# Patient Record
Sex: Female | Born: 1980 | Hispanic: No | Marital: Single | Smoking: Current some day smoker
Health system: Southern US, Community
[De-identification: ages and names within clinical notes are randomized; demographics above are authoritative.]

## PROBLEM LIST (undated history)

## (undated) DIAGNOSIS — E079 Disorder of thyroid, unspecified: Secondary | ICD-10-CM

## (undated) DIAGNOSIS — J45909 Unspecified asthma, uncomplicated: Secondary | ICD-10-CM

## (undated) DIAGNOSIS — I10 Essential (primary) hypertension: Secondary | ICD-10-CM

---

## 2017-12-29 ENCOUNTER — Emergency Department
Admission: EM | Admit: 2017-12-29 | Discharge: 2017-12-29 | Disposition: A | Discharge status: Home / Self Care | Payer: 59 | Attending: Emergency Medicine | Admitting: Emergency Medicine

## 2017-12-29 ENCOUNTER — Emergency Department: Payer: 59

## 2017-12-29 ENCOUNTER — Other Ambulatory Visit: Payer: Self-pay

## 2017-12-29 DIAGNOSIS — F17200 Nicotine dependence, unspecified, uncomplicated: Secondary | ICD-10-CM | POA: Insufficient documentation

## 2017-12-29 DIAGNOSIS — J45901 Unspecified asthma with (acute) exacerbation: Secondary | ICD-10-CM

## 2017-12-29 DIAGNOSIS — J4541 Moderate persistent asthma with (acute) exacerbation: Secondary | ICD-10-CM | POA: Diagnosis not present

## 2017-12-29 DIAGNOSIS — R0602 Shortness of breath: Secondary | ICD-10-CM | POA: Diagnosis present

## 2017-12-29 HISTORY — DX: Unspecified asthma, uncomplicated: J45.909

## 2017-12-29 MED ORDER — PREDNISONE 20 MG PO TABS
60.0000 mg | ORAL_TABLET | Freq: Once | ORAL | Status: AC
  Administered 2017-12-29: 60 mg via ORAL
  Filled 2017-12-29: qty 3

## 2017-12-29 MED ORDER — IPRATROPIUM-ALBUTEROL 0.5-2.5 (3) MG/3ML IN SOLN
3.0000 mL | Freq: Once | RESPIRATORY_TRACT | Status: AC
  Administered 2017-12-29: 3 mL via RESPIRATORY_TRACT
  Filled 2017-12-29: qty 6

## 2017-12-29 MED ORDER — IPRATROPIUM-ALBUTEROL 0.5-2.5 (3) MG/3ML IN SOLN: 3.0000 mL | Freq: Four times a day (QID) | RESPIRATORY_TRACT | 1 refills | Status: AC | PRN

## 2017-12-29 MED ORDER — IPRATROPIUM-ALBUTEROL 0.5-2.5 (3) MG/3ML IN SOLN
3.0000 mL | Freq: Once | RESPIRATORY_TRACT | Status: AC
  Administered 2017-12-29: 3 mL via RESPIRATORY_TRACT

## 2017-12-29 MED ORDER — ALBUTEROL SULFATE (2.5 MG/3ML) 0.083% IN NEBU: 5.0000 mg | INHALATION_SOLUTION | Freq: Once | RESPIRATORY_TRACT | Status: DC

## 2017-12-29 NOTE — ED Provider Notes (Signed)
[facility] Emergency Department Provider Note       Time seen: ----------------------------------------- 4:54 PM on 12/29/2017 -----------------------------------------   I have reviewed the triage vital signs and the nursing notes.  HISTORY   Chief Complaint Shortness of Breath    HPI Stacy Hall is a 37 y.o. female with a history of asthma who presents to the ED for [place] of breath started 2 days ago.  Patient moved here proximal 4 days ago and since then she is been having increased shortness of breath.  She typically sees a pulmonologist every 2 weeks and receives an injection of Xolair every month.  She denies fevers, chills or other complaints.  Past Medical History:  Diagnosis Date  . Asthma     There are no active problems to display for this patient.   History reviewed. No pertinent surgical history.  Allergies Biaxin [clarithromycin] and Sulfa antibiotics  Social History Social History   Tobacco Use  . Smoking status: Current Some Day Smoker  . Smokeless tobacco: Never Used  Substance Use Topics  . Alcohol use: Not Currently  . Drug use: Never   Review of Systems Constitutional: Negative for fever. ENT: Positive for congestion Cardiovascular: Negative for chest pain. Respiratory: Positive for shortness of breath and wheezing Gastrointestinal: Negative for abdominal pain, vomiting and diarrhea. Musculoskeletal: Negative for back pain. Skin: Negative for rash. Neurological: Negative for headaches, focal weakness or numbness.  All systems negative/normal/unremarkable except as stated in the HPI  ____________________________________________   PHYSICAL EXAM:  VITAL SIGNS: ED Triage Vitals  Enc Vitals Group     BP 12/29/17 1605 137/86     Pulse Rate 12/29/17 1605 81     Resp 12/29/17 1605 16     Temp 12/29/17 1605 98.7 F (37.1 C)     Temp Source 12/29/17 1605 Oral     SpO2 12/29/17 1605 97 %     Weight 12/29/17 1605  250 lb (113.4 kg)     Height 12/29/17 1605 5\' 3"  (1.6 m)     Head Circumference --      Peak Flow --      Pain Score 12/29/17 1616 5     Pain Loc --      Pain Edu? --      Excl. in GC? --    Constitutional: Alert and oriented. Well appearing and in no distress. Eyes: Conjunctivae are normal. Normal extraocular movements. ENT   Head: Normocephalic and atraumatic.   Nose: No congestion/rhinnorhea.   Mouth/Throat: Mucous membranes are moist.   Neck: No stridor. Cardiovascular: Normal rate, regular rhythm. No murmurs, rubs, or gallops. Respiratory: Normal respiratory effort without tachypnea nor retractions. Breath sounds are equal with mild wheezing Gastrointestinal: Soft and nontender. Normal bowel sounds Musculoskeletal: Nontender with normal range of motion in extremities. No lower extremity tenderness nor edema. Neurologic:  Normal speech and language. No gross focal neurologic deficits are appreciated.  Skin:  Skin is warm, dry and intact. No rash noted. Psychiatric: Mood and affect are normal. Speech and behavior are normal.  ____________________________________________  ED COURSE:  As part of my medical decision making, I reviewed the following data within the electronic MEDICAL RECORD NUMBER History obtained from family if available, nursing notes, old chart and ekg, as well as notes from prior ED visits. Patient presented for [place] of breath and likely asthma exacerbation, we will assess with  imaging as indicated at this time.  She will receive a DuoNeb and oral steroids.  Procedures RADIOLOGY Images were viewed by me  Chest x-ray is unremarkable  ____________________________________________  DIFFERENTIAL DIAGNOSIS   Asthma, URI, allergies, pneumonia  FINAL ASSESSMENT AND PLAN  Asthma exacerbation   Plan: The patient had presented for shortness of breath. Patient's imaging reveal any acute process.  She was feeling better after DuoNeb but I will write for  same.  I have encouraged her to use albuterol unless she feels like she cannot breathe and then to try a DuoNeb breathing treatment instead.  She states she has prednisone at home, I have advised she take a 5-day burst of 50 mg.   Ulice Dash, MD   Note: This note was generated in part or whole with voice recognition software. Voice recognition is usually quite accurate but there are transcription errors that can and very often do occur. I apologize for any typographical errors that were not detected and corrected.     Emily Filbert, MD 12/29/17 1750

## 2017-12-29 NOTE — ED Triage Notes (Signed)
Pt arrives via POV, pt ambulatory to triage with NAD noted at this time. Pt states SOB started 2 days ago, moved here 4 days ago, pt reports asthma and sees a pulmonologist every 2 weeks. Pt state has an apt on tues with specialist here about allergies. Pt able to speak in full sentences without distress during triage.

## 2017-12-29 NOTE — ED Notes (Signed)
NAD noted at time of D/C. Pt denies questions or concerns. Pt ambulatory to the lobby at this time.  

## 2017-12-31 ENCOUNTER — Institutional Professional Consult (permissible substitution): Payer: Self-pay | Admitting: Internal Medicine

## 2018-01-03 ENCOUNTER — Ambulatory Visit (INDEPENDENT_AMBULATORY_CARE_PROVIDER_SITE_OTHER): Payer: 59 | Admitting: Internal Medicine

## 2018-01-03 ENCOUNTER — Encounter: Payer: Self-pay | Admitting: Internal Medicine

## 2018-01-03 VITALS — BP 146/90 | HR 100 | Ht 61.75 in | Wt 250.0 lb

## 2018-01-03 DIAGNOSIS — J45909 Unspecified asthma, uncomplicated: Secondary | ICD-10-CM | POA: Insufficient documentation

## 2018-01-03 DIAGNOSIS — F17210 Nicotine dependence, cigarettes, uncomplicated: Secondary | ICD-10-CM | POA: Insufficient documentation

## 2018-01-03 LAB — NITRIC OXIDE: NITRIC OXIDE: 6

## 2018-01-03 MED ORDER — BUDESONIDE-FORMOTEROL FUMARATE 160-4.5 MCG/ACT IN AERO: 2.0000 | INHALATION_SPRAY | Freq: Two times a day (BID) | RESPIRATORY_TRACT | 0 refills | Status: DC

## 2018-01-03 MED ORDER — BUDESONIDE-FORMOTEROL FUMARATE 160-4.5 MCG/ACT IN AERO: INHALATION_SPRAY | RESPIRATORY_TRACT | 12 refills | Status: AC

## 2018-01-03 MED ORDER — OMEPRAZOLE 40 MG PO CPDR: DELAYED_RELEASE_CAPSULE | ORAL | 2 refills | Status: DC

## 2018-01-03 MED ORDER — CEFDINIR 300 MG PO CAPS: 300.0000 mg | ORAL_CAPSULE | Freq: Two times a day (BID) | ORAL | 0 refills | Status: DC

## 2018-01-03 NOTE — Progress Notes (Signed)
Stacy Hall, female    DOB: 06/23/1980,    MRN: [id number]    History of Present Illness  37 yowf active smoker with dx of asthma as child multiple admits intubated p  First started smoking  cigarettes at age 37 placed on xolair as maint 2011 and overall improved and "no need for maint inhalers" but still needed maybe twice yearly prednisone courses, even  more while actively smoking  self referred to pulmonary clinic 01/03/2018 p moving from [place] to [facility] Sept 24th and already in ER 12/29/17   History of Present Illness  01/03/2018  Pulmonary / 1st office eval  Chief Complaint  Patient presents with  . Pulmonary Consult    Self referral. Pt states dxed with allergic asthma at age 2. She moves to [place] from [place] a wk ago. She states once she moved here she started to have increased chest tightness and inability to take in a deep breath. She has been on Xolair for 8 yrs and is 2 wks late for her injection. She is using her Duoneb 2 x daily and her albuterol inhaler 2-3 x per day.   Arrived in [place] Sept 24th last dose of xolair first week of September but by 12/28/17  On avg day before onset of flare 2  X 2 x daily > then neb every 4 hours  Dark green chunks in am assoc with ?sinus HA in am as well  Last pred 01/02/18  Prone flat bed 2 pillows  Had symptoms sitting driving over to office p last saba neb 3h   of ov and admits to considerable anxiety contributing   No obvious day to day or daytime variability or assoc  mucus plugs or hemoptysis or cp or   subjective wheeze or overt sinus or hb symptoms.    Sleeps ok  without nocturnal  or early am exacerbation  of respiratory  c/o's or need for noct saba. Also denies any obvious fluctuation of symptoms with weather or environmental changes or other aggravating or alleviating factors except as outlined above   No unusual exposure hx or h/o childhood pna/ asthma or knowledge of premature birth.  Current Allergies, Complete Past Medical History, Past  Surgical History, Family History, and Social History were reviewed in [employer] record.  ROS  The following are not active complaints unless bolded Hoarseness, sore throat, dysphagia, dental problems, itching, sneezing,  nasal congestion or discharge of excess mucus or purulent secretions, ear ache,   fever, chills, sweats, unintended wt loss or wt gain, classically pleuritic or exertional cp,  orthopnea pnd or arm/hand swelling  or leg swelling, presyncope, palpitations, abdominal pain, anorexia, nausea, vomiting, diarrhea  or change in bowel habits or change in bladder habits, change in stools or change in urine, dysuria, hematuria,  rash, arthralgias, visual complaints, headache bifrontal esp in am , numbness, weakness or ataxia or problems with walking or coordination,  change in mood or  memory.            Past Medical History:  Diagnosis Date  . Asthma     Outpatient Medications Prior to Visit  Medication Sig Dispense Refill  . albuterol (PROAIR HFA) 108 (90 Base) MCG/ACT inhaler Inhale 2 puffs into the lungs every 6 (six) hours as needed for wheezing or shortness of breath.    . ALPRAZolam (XANAX) 0.5 MG tablet Take 0.5 mg by mouth at bedtime as needed for anxiety.    Marland Kitchen ipratropium-albuterol (DUONEB) 0.5-2.5 (3) MG/3ML  SOLN Take 3 mLs by nebulization every 6 (six) hours as needed. 360 mL 1  . levothyroxine (SYNTHROID, LEVOTHROID) 50 MCG tablet Take 50 mcg by mouth daily before breakfast.    . loratadine-pseudoephedrine (CLARITIN-D 24-HOUR) 10-240 MG 24 hr tablet Take 1 tablet by mouth daily.    Marland Kitchen omeprazole (PRILOSEC) 20 MG capsule Take 20 mg by mouth daily.    Marland Kitchen PARoxetine (PAXIL) 10 MG tablet Take 10 mg by mouth daily.               Objective:     BP (!) 146/90 (BP Location: Left Arm, Cuff Size: Normal)   Pulse 100   Ht 5' 1.75" (1.568 m)   Wt 250 lb (113.4 kg)   LMP 12/23/2017   SpO2 98%   BMI 46.10 kg/m   SpO2: 98 % RA   HEENT: nl  dentition, turbinates bilaterally, and oropharynx. Nl external ear canals without cough reflex   NECK :  without JVD/Nodes/TM/ nl carotid upstrokes bilaterally   LUNGS: no acc muscle use,  Nl contour chest which is clear to A and P bilaterally without cough on insp or exp maneuvers   CV:  RRR  no s3 or murmur or increase in P2, and no edema   ABD:  Obese /soft and nontender with nl inspiratory excursion in the supine position. No bruits or organomegaly appreciated, bowel sounds nl  MS:  Nl gait/ ext warm without deformities, calf tenderness, cyanosis or clubbing No obvious joint restrictions   SKIN: warm and dry without lesions    NEURO:  alert, approp, nl sensorium with  no motor or cerebellar deficits apparent.      I personally reviewed images and agree with radiology impression as follows:  CXR:   12/29/17 No active cardiopulmonary disease.      Assessment   Allergic asthma FENO 01/03/2018  =   6  Last pred 01/02/18  - Spirometry 01/03/2018  FEV1 1.7 (61%)  Ratio 83 w/in 3 h of duoneb  But still sense sob at rest - 01/03/2018  After extensive coaching inhaler device,  effectiveness =    90% from baseline 50% > try symb 160 2bid    DDX of  difficult airways management almost all start with A and  include Adherence, Ace Inhibitors, Acid Reflux, Active Sinus Disease, Alpha 1 Antitripsin deficiency, Anxiety masquerading as Airways dz,  ABPA,  Allergy(esp in young), Aspiration (esp in elderly), Adverse effects of meds,  Active smokers, A bunch of PE's (a small clot burden can't cause this syndrome unless there is already severe underlying pulm or vascular dz with poor reserve) plus two Bs  = Bronchiectasis and Beta blocker use..and one C= CHF    Adherence is always the initial "prime suspect" and is a multilayered concern that requires a "trust but verify" approach in every patient - starting with knowing how to use medications, especially inhalers, correctly, keeping up with  refills and understanding the fundamental difference between maintenance and prns vs those medications only taken for a very short course and then stopped and not refilled.  - see hfa teaching  - return  with all meds in hand using a trust but verify approach to confirm accurate Medication  Reconciliation The principal here is that until we are certain that the  patients are doing what we've asked, it makes no sense to ask them to do more.    Active smoking top of the rest of the list (see separate a/p)   ?  Acid (or non-acid) GERD > always difficult to exclude as up to 75% of pts in some series report no assoc GI/ Heartburn symptoms> rec max (24h)  acid suppression and diet restrictions/ reviewed and instructions given in writing.   Allergy/ ? xolair dep (doubt in absence of more rhinitis or obvious atopic hx) will try high dose symbicort as clearly breaking rule of 2s while on xolair chronically.  - will explore whether insurance will just be transferring xolair rx here or not.  ? Active sinus dz > omnicef x 10 days then sinus ct prn   ? abpa > allergy profile next ov with repeat IgE office before restarting singulair if possible   ? Anxiety > usually at the bottom of this list of usual suspects but should be much higher on this pt's based on H and P and note already on psychotropics and may interfere with adherence and also interpretation of response or lack thereof to symptom management which can be quite subjective.              Cigarette smoker 4-5 min discussion re active cigarette smoking in addition to office E&M  Ask about tobacco use:   Active as is her partner Advise quitting   I emphasized that although we never turn away smokers from the pulmonary clinic, we do ask that they understand that the recommendations that we make  won't work nearly as well in the presence of continued cigarette exposure. In fact, we may very well  reach a point where we can't promise to help the  patient if he/she can't quit smoking. (We can and will promise to try to help, we just can't promise what we recommend will really work)  Assess willingness:  Not committed at this point Assist in quit attempt:  Per PCP when ready Arrange follow up:   Follow up per Primary Care planned        Morbid obesity due to excess calories (HCC) Body mass index is 46.1 kg/m.  -   No results found for: TSH   Contributing to gerd risk/ doe/reviewed the need and the process to achieve and maintain neg calorie balance > defer f/u primary care including intermittently monitoring thyroid status       Total time devoted to counseling  > 50 % of initial 60 min office visit:  review case with pt/ discussion of options/alternatives/ personally creating written customized instructions  in presence of pt  then going over those specific  Instructions directly with the pt including how to use all of the meds but in particular covering each new medication in detail and the difference between the maintenance= "automatic" meds and the prns using an action plan format for the latter (If this problem/symptom => do that organization reading Left to right).  Please see AVS from this visit for a full list of these instructions which I personally wrote for this pt and  are unique to this visit.   See device teaching which extended face to face time for this visit      Sandrea Hughs, MD 01/03/2018

## 2018-01-03 NOTE — Assessment & Plan Note (Addendum)
FENO 01/03/2018  =   6  Last pred 01/02/18  - Spirometry 01/03/2018  FEV1 1.7 (61%)  Ratio 83 w/in 3 h of duoneb  But still sense sob at rest - 01/03/2018  After extensive coaching inhaler device,  effectiveness =    90% from baseline 50% > try symb 160 2bid    DDX of  difficult airways management almost all start with A and  include Adherence, Ace Inhibitors, Acid Reflux, Active Sinus Disease, Alpha 1 Antitripsin deficiency, Anxiety masquerading as Airways dz,  ABPA,  Allergy(esp in young), Aspiration (esp in elderly), Adverse effects of meds,  Active smokers, A bunch of PE's (a small clot burden can't cause this syndrome unless there is already severe underlying pulm or vascular dz with poor reserve) plus two Bs  = Bronchiectasis and Beta blocker use..and one C= CHF    Adherence is always the initial "prime suspect" and is a multilayered concern that requires a "trust but verify" approach in every patient - starting with knowing how to use medications, especially inhalers, correctly, keeping up with refills and understanding the fundamental difference between maintenance and prns vs those medications only taken for a very short course and then stopped and not refilled.  - see hfa teaching  - return  with all meds in hand using a trust but verify approach to confirm accurate Medication  Reconciliation The principal here is that until we are certain that the  patients are doing what we've asked, it makes no sense to ask them to do more.    Active smoking top of the rest of the list (see separate a/p)   ? Acid (or non-acid) GERD > always difficult to exclude as up to 75% of pts in some series report no assoc GI/ Heartburn symptoms> rec max (24h)  acid suppression and diet restrictions/ reviewed and instructions given in writing.   Allergy/ ? xolair dep (doubt in absence of more rhinitis or obvious atopic hx) will try high dose symbicort as clearly breaking rule of 2s while on xolair chronically.  - will  explore whether insurance will just be transferring xolair rx here or not.  ? Active sinus dz > omnicef x 10 days then sinus ct prn   ? abpa > allergy profile next ov with repeat IgE office before restarting singulair if possible   ? Anxiety > usually at the bottom of this list of usual suspects but should be much higher on this pt's based on H and P and note already on psychotropics and may interfere with adherence and also interpretation of response or lack thereof to symptom management which can be quite subjective.

## 2018-01-03 NOTE — Assessment & Plan Note (Signed)
Body mass index is 46.1 kg/m.  -   No results found for: TSH   Contributing to gerd risk/ doe/reviewed the need and the process to achieve and maintain neg calorie balance > defer f/u primary care including intermittently monitoring thyroid status       Total time devoted to counseling  > 50 % of initial 60 min office visit:  review case with pt/ discussion of options/alternatives/ personally creating written customized instructions  in presence of pt  then going over those specific  Instructions directly with the pt including how to use all of the meds but in particular covering each new medication in detail and the difference between the maintenance= "automatic" meds and the prns using an action plan format for the latter (If this problem/symptom => do that organization reading Left to right).  Please see AVS from this visit for a full list of these instructions which I personally wrote for this pt and  are unique to this visit.   See device teaching which extended face to face time for this visit

## 2018-01-03 NOTE — Patient Instructions (Addendum)
Plan A = Automatic = Symbicort 160 Take 2 puffs first thing in am and then another 2 puffs about 12 hours later.   Work on inhaler technique:  relax and gently blow all the way out then take a nice smooth deep breath back in, triggering the inhaler at same time you start breathing in.  Hold for up to 5 seconds if you can. Blow out thru nose. Rinse and gargle with water when done   Plan B = Backup Only use your albuterol as a rescue medication to be used if you can't catch your breath by resting or doing a relaxed purse lip breathing pattern.  - The less you use it, the better it will work when you need it. - Ok to use the inhaler up to 2 puffs  every 4 hours if you must but call for appointment if use goes up over your usual need - Don't leave home without it !!  (think of it like the spare tire for your car)    Plan C = Crisis - only use your albuterol nebulizer if you first try Plan B and it fails to help > ok to use the nebulizer up to every 4 hours but if start needing it regularly call for immediate appointment   Omnicef 300 mg twice daily x 10 days should turn the mucus back to white and help you sinus pain and if not call for sinus CT to be scheduled before your next visit   Omeprazole should be Take 30- 60 min before your first and last meals of the day   GERD (REFLUX)  is an extremely common cause of respiratory symptoms just like yours , many times with no obvious heartburn at all.    It can be treated with medication, but also with lifestyle changes including elevation of the head of your bed (ideally with 6 inch  bed blocks),  Smoking cessation, avoidance of late meals, excessive alcohol, and avoid fatty foods, chocolate, peppermint, colas, red wine, and acidic juices such as orange juice.  NO MINT OR MENTHOL PRODUCTS SO NO COUGH DROPS   USE SUGARLESS CANDY INSTEAD (Jolley ranchers or Stover's or Life Savers) or even ice chips will also do - the key is to swallow to prevent all  throat clearing. NO OIL BASED VITAMINS - use powdered substitutes.   I will check on your xolair injections prior to next visit     Please schedule a follow up office visit in 2  weeks, sooner if needed  with all medications /inhalers/ solutions in hand so we can verify exactly what you are taking. This includes all medications from all doctors and over the counters

## 2018-01-03 NOTE — Assessment & Plan Note (Signed)
4-5 min discussion re active cigarette smoking in addition to office E&M  Ask about tobacco use:   Active as is her partner Advise quitting   I emphasized that although we never turn away smokers from the pulmonary clinic, we do ask that they understand that the recommendations that we make  won't work nearly as well in the presence of continued cigarette exposure. In fact, we may very well  reach a point where we can't promise to help the patient if he/she can't quit smoking. (We can and will promise to try to help, we just can't promise what we recommend will really work)  Assess willingness:  Not committed at this point Assist in quit attempt:  Per PCP when ready Arrange follow up:   Follow up per Primary Care planned

## 2018-01-06 ENCOUNTER — Telehealth: Payer: Self-pay | Admitting: Internal Medicine

## 2018-01-06 NOTE — Telephone Encounter (Signed)
Called pt and let her know we had her Xolair and supplies. She was happy to hear that.  Pt received a letter from her ins or spec. Pharm. Letting her know her P/A runs out in Nov.. Pt is going to bring it with her when she comes in for her inj.. She is still in [place] but she is moving to [place].  Will leave encounter open until pt come in for appt., then record P/A notes.

## 2018-01-13 ENCOUNTER — Telehealth: Payer: Self-pay | Admitting: Internal Medicine

## 2018-01-13 NOTE — Telephone Encounter (Signed)
Noted. Will route this to Dimas Millin for her to follow up on once she returns back to the office tomorrow, 10/15.  Tammy please advise on this for pt. Thanks!

## 2018-01-13 NOTE — Telephone Encounter (Signed)
Sorry I do not see any Xolair or supplies here for patient. She will need to wait until Dimas Millin in back tomorrow and have her contact her about apt. Thanks.

## 2018-01-13 NOTE — Telephone Encounter (Signed)
Called and spoke with pt who wanted to know if it would be possible for her to get her xolair injections tomorrow when she came for her OV with MW at 3pm.  Pt does not have an appt scheduled for the xolair injections.  Routing to [employer] whom is covering injections today to see if she could help pt out with this.

## 2018-01-14 ENCOUNTER — Encounter: Payer: Self-pay | Admitting: Internal Medicine

## 2018-01-14 ENCOUNTER — Ambulatory Visit (INDEPENDENT_AMBULATORY_CARE_PROVIDER_SITE_OTHER): Payer: 59

## 2018-01-14 ENCOUNTER — Ambulatory Visit (INDEPENDENT_AMBULATORY_CARE_PROVIDER_SITE_OTHER): Payer: 59 | Admitting: Internal Medicine

## 2018-01-14 VITALS — BP 136/90 | HR 100 | Ht 61.75 in | Wt 250.0 lb

## 2018-01-14 DIAGNOSIS — R05 Cough: Secondary | ICD-10-CM | POA: Diagnosis not present

## 2018-01-14 DIAGNOSIS — R059 Cough, unspecified: Secondary | ICD-10-CM

## 2018-01-14 DIAGNOSIS — J4530 Mild persistent asthma, uncomplicated: Secondary | ICD-10-CM

## 2018-01-14 MED ORDER — OMALIZUMAB 150 MG ~~LOC~~ SOLR
150.0000 mg | Freq: Once | SUBCUTANEOUS | Status: AC
  Administered 2018-01-14: 150 mg via SUBCUTANEOUS

## 2018-01-14 NOTE — Patient Instructions (Addendum)
Please see patient coordinator before you leave today  to schedule Sinus CT - in meantime finish the doxy   GERD (REFLUX)  is an extremely common cause of respiratory symptoms just like yours , many times with no obvious heartburn at all.    It can be treated with medication, but also with lifestyle changes including elevation of the head of your bed (ideally with 6 inch  bed blocks),  Smoking cessation, avoidance of late meals, excessive alcohol, and avoid fatty foods, chocolate, peppermint, colas, red wine, and acidic juices such as orange juice.  NO MINT OR MENTHOL PRODUCTS SO NO COUGH DROPS   USE SUGARLESS CANDY INSTEAD (Jolley ranchers or Stover's or Life Savers) or even ice chips will also do - the key is to swallow to prevent all throat clearing. NO OIL BASED VITAMINS - use powdered substitutes.  Prilosec 40 mg Take 30- 60 min before your first and last meals of the day   Prednisone 20 mg daily now and every Am until 100% back to normal then 10 mg x 3 days and 5 mg x 3 days and stop    Please schedule a follow up office visit in 4 weeks, sooner if needed  with all medications /inhalers/ solutions in hand so we can verify exactly what you are taking. This includes all medications from all doctors and over the counters

## 2018-01-14 NOTE — Progress Notes (Signed)
Documented by Boone Master CMA based on hand-written Xolair documentation sheet completed by [facility] CMA, who administered the medication.  Per the hand-written documentation by Dimas Millin CMA, patient answered the following questions:  Have you been in the hospital in the past 10 days?  NO.  Urgent Care 2 days ago for sinus. Do you have a fever?  NO. Do you have a cough?  YES.  Dry until today.  Per Tammy Scott's CMA documentation: Infection, they put her on doxy.  Okay to give per MW.

## 2018-01-14 NOTE — Telephone Encounter (Signed)
Called pt and left her a message. She called back. I was able to catch MW in between pt.s, he said he was ok with her getting her injection today. She will not have to wait. Nothing further needed.

## 2018-01-14 NOTE — Progress Notes (Signed)
Stacy Hall, female    DOB: [birth year]    MRN: [id number]    History of Present Illness  37 yowf active smoker with dx of asthma as child multiple admits intubated p  First started smoking  cigarettes at age 37 placed on xolair as maint 2011 and overall improved and "no need for maint inhalers" but still needed maybe twice yearly prednisone courses, even  more while actively smoking  self referred to pulmonary clinic 01/03/2018 p moving from [place] to [facility] Sept 24th and already in ER 12/29/17    History of Present Illness  01/03/2018  Pulmonary / 1st office eval  Chief Complaint  Patient presents with  . Pulmonary Consult    Self referral. Pt states dxed with allergic asthma at age 37. She moves to [place] from [place] a wk ago. She states once she moved here she started to have increased chest tightness and inability to take in a deep breath. She has been on Xolair for 8 yrs and is 2 wks late for her injection. She is using her Duoneb 2 x daily and her albuterol inhaler 2-3 x per day.   Arrived in [place] Sept 24th last dose of xolair first week of September but by 12/28/17  On avg day before onset of flare 2  X 2 x daily > then neb every 4 hours  Dark green chunks in am assoc with ?sinus HA in am as well  Last pred 01/02/18  Prone flat bed 2 pillows  Had symptoms sitting driving over to office p last saba neb 3h   of ov and admits to considerable anxiety contributing  rec Plan A = Automatic = Symbicort 160 Take 2 puffs first thing in am and then another 2 puffs about 12 hours later.  Work on inhaler technique:  relax and gently blow all the way out then take a nice smooth deep breath back in, triggering the inhaler at same time you start breathing in.  Hold for up to 5 seconds if you can. Blow out thru nose. Rinse and gargle with water when done Plan B = Backup Only use your albuterol as a rescue medication Plan C = Crisis - only use your albuterol nebulizer if you first try Plan B   Omnicef 300 mg  twice daily x 10 days should turn the mucus back to white and help you sinus pain and if not call for sinus CT to be scheduled before your next visit  Omeprazole should be Take 30- 60 min before your first and last meals of the day  GERD diet  I will check on your xolair injections prior to next visit   Please schedule a follow up office visit in 2  weeks, sooner if needed  with all medications /inhalers/ solutions in hand so we can verify exactly what you are taking. This includes all medications from all doctors and over the counters    01/14/2018  f/u ov/Stacy Hall re: chronic asthma / worse cough/ says not smoking but did not follow instructions/ no meds  Chief Complaint  Patient presents with  . Follow-up    Pt states she her breathing worsened after last visit and she went to [facility] 01/13/18..She c/o sinus pressure and cough with yellow to green sputum.  She was started on Doxy. She is not using her albuterol inhaler but she has used neb a few times recently.   for the last year needed prednisone maybe 3 x times last time was on  Jan 02 2018 and never really reduced saba  dep  Was better on neb saba dependency  until 3 days prior to OV  With onset of itching / wheezing and nasal congestion then yellow d/c then to UC  one day prior to OV  And rx doxy  "no better"    No obvious day to day or daytime variability or assoc  mucus plugs or hemoptysis or cp or chest tightness, subjective wheeze or overt   hb symptoms.     Also denies any obvious fluctuation of symptoms with weather or environmental changes or other aggravating or alleviating factors except as outlined above   No unusual exposure hx or h/o childhood pna/ asthma or knowledge of premature birth.  Current Allergies, Complete Past Medical History, Past Surgical History, Family History, and Social History were reviewed in [employer] record.  ROS  The following are not active complaints unless bolded Hoarseness, sore  throat, dysphagia, dental problems, itching, sneezing,  nasal congestion or discharge of excess mucus or purulent secretions, ear ache,   fever, chills, sweats, unintended wt loss or wt gain, classically pleuritic or exertional cp,  orthopnea pnd or arm/hand swelling  or leg swelling, presyncope, palpitations, abdominal pain, anorexia, nausea, vomiting, diarrhea  or change in bowel habits or change in bladder habits, change in stools or change in urine, dysuria, hematuria,  rash, arthralgias, visual complaints, headache, numbness, weakness or ataxia or problems with walking or coordination,  change in mood or  memory.        Current Meds  Medication Sig  . albuterol (PROAIR HFA) 108 (90 Base) MCG/ACT inhaler Inhale 2 puffs into the lungs every 6 (six) hours as needed for wheezing or shortness of breath.  . ALPRAZolam (XANAX) 0.5 MG tablet Take 0.5 mg by mouth at bedtime as needed for anxiety.  . budesonide-formoterol (SYMBICORT) 160-4.5 MCG/ACT inhaler Take 2 puffs first thing in am and then another 2 puffs about 12 hours later.  . cefdinir (OMNICEF) 300 MG capsule Take 1 capsule (300 mg total) by mouth 2 (two) times daily.  . Chlorphen-Pseudoephed-APAP ([facility] COLD PLUS PO) Take by mouth as needed.  . doxycycline (VIBRAMYCIN) 100 MG capsule Take 1 capsule by mouth 2 (two) times daily.  Marland Kitchen ipratropium-albuterol (DUONEB) 0.5-2.5 (3) MG/3ML SOLN Take 3 mLs by nebulization every 6 (six) hours as needed.  Marland Kitchen levothyroxine (SYNTHROID, LEVOTHROID) 50 MCG tablet Take 50 mcg by mouth daily before breakfast.  . loratadine-pseudoephedrine (CLARITIN-D 24-HOUR) 10-240 MG 24 hr tablet Take 1 tablet by mouth daily.  Marland Kitchen omeprazole (PRILOSEC) 40 MG capsule Take 30- 60 min before your first and last meals of the day  . PARoxetine (PAXIL) 10 MG tablet Take 10 mg by mouth daily.           Objective:      amb wf harsh cough, chewing mint gum with neg attitude re response to rx/ adherence issues   Wt Readings from  Last 3 Encounters:  01/14/18 250 lb (113.4 kg)  01/03/18 250 lb (113.4 kg)  12/29/17 250 lb (113.4 kg)     Vital signs reviewed - Note on arrival 02 sats  97% on RA       HEENT: nl dentition,   and oropharynx. Nl external ear canals without cough reflex - moderate bilateral non-specific turbinate edema     NECK :  without JVD/Nodes/TM/ nl carotid upstrokes bilaterally   LUNGS: no acc muscle use,  Nl contour chest with insp/exp rhonchi  s true wheezing bilaterally without cough on insp or exp maneuvers   CV:  RRR  no s3 or murmur or increase in P2, and no edema   ABD:  soft and nontender with nl inspiratory excursion in the supine position. No bruits or organomegaly appreciated, bowel sounds nl  MS:  Nl gait/ ext warm without deformities, calf tenderness, cyanosis or clubbing No obvious joint restrictions   SKIN: warm and dry without lesions    NEURO:  alert, approp, nl sensorium with  no motor or cerebellar deficits apparent.         Assessment

## 2018-01-15 ENCOUNTER — Encounter: Payer: Self-pay | Admitting: Internal Medicine

## 2018-01-15 NOTE — Assessment & Plan Note (Addendum)
FENO 01/03/2018  =   6  Last pred 01/02/18  - Spirometry 01/03/2018  FEV1 1.7 (61%)  Ratio 83 w/in 3 h of duoneb  But still sense sob at rest - 01/03/2018    try symb 160 2bid   - Xolair 2011 in [place] and restarted in [place] 01/14/2018  - 01/14/2018  After extensive coaching inhaler device,  effectiveness =    90%    Asthma remains poorly controlled with overuse of saba neb DDX of  difficult airways management almost all start with A and  include Adherence, Ace Inhibitors, Acid Reflux, Active Sinus Disease, Alpha 1 Antitripsin deficiency, Anxiety masquerading as Airways dz,  ABPA,  Allergy(esp in young), Aspiration (esp in elderly), Adverse effects of meds,  Active smoking or vaping, A bunch of PE's (a small clot burden can't cause this syndrome unless there is already severe underlying pulm or vascular dz with poor reserve) plus two Bs  = Bronchiectasis and Beta blocker use..and one C= CHF   Adherence is always the initial "prime suspect" and is a multilayered concern that requires a "trust but verify" approach in every patient - starting with knowing how to use medications, especially inhalers, correctly, keeping up with refills and understanding the fundamental difference between maintenance and prns vs those medications only taken for a very short course and then stopped and not refilled.  - did not bring meds as req - overusing saba again including neb form - see hfa teaching / has mastered now so should do better on symb 160/ prn saba hfa    ? Acid (or non-acid) GERD > always difficult to exclude as up to 75% of pts in some series report no assoc GI/ Heartburn symptoms> rec max (24h)  acid suppression and diet restrictions/ reviewed and instructions given in writing.  NO MINTS  ? Active smoke/ vape > denies at present but has not really committed to completely/ permanently quitting  ? Active sinus dz > check sinus ct now   ? Allergy > xolair restarted prior to checking labs as I intended but turns  out she will be living in [place] and working in [place] in the near future so will need to be under the care of a more proximal allergy/ pulmonary doc and will need to establish once she's settled rather than traveling back and forth to [place] for [employer].  ? Anxiety /depression > usually at the bottom of this list of usual suspects but should be much higher on this pt's based on H and P and note already on psychotropics and may interfere with adherence and also interpretation of response or lack thereof to symptom management which can be quite subjective.     I had an extended discussion with the patient reviewing all relevant studies completed to date and  lasting 25 minutes of a 40  minute office visit  re  severe non-specific but potentially very serious refractory respiratory symptoms of uncertain and potentially multiple  etiologies.   Each maintenance medication was reviewed in detail including most importantly the difference between maintenance and prns and under what circumstances the prns are to be triggered using an action plan format that is not reflected in the computer generated alphabetically organized AVS.    Please see AVS for specific instructions unique to this office visit that I personally wrote and verbalized to the the pt in detail and then reviewed with pt  by my nurse highlighting any changes in therapy/plan of care  recommended at  today's visit.      See device teaching which extended face to face time for this visit

## 2018-01-15 NOTE — Telephone Encounter (Signed)
Pt called, I returned her call, pt called me back. Pt. Had a sinus infection and has been sick since she's been off xolair. I asked MW if she could get her shot before or after he saw her. He said that would be fine. Pt was put on my schedule, arrived and I gave her, her Geoffry Paradise before she saw MW. Per LR & MW.  Pt didn't bring in a P/A letter, either she forgot it it or she gave it to Blue.  Nothing further needed.

## 2018-01-28 ENCOUNTER — Ambulatory Visit (INDEPENDENT_AMBULATORY_CARE_PROVIDER_SITE_OTHER): Payer: 59

## 2018-01-28 DIAGNOSIS — J45909 Unspecified asthma, uncomplicated: Secondary | ICD-10-CM | POA: Diagnosis not present

## 2018-01-28 MED ORDER — OMALIZUMAB 150 MG ~~LOC~~ SOLR
375.0000 mg | Freq: Once | SUBCUTANEOUS | Status: AC
  Administered 2018-01-28: 375 mg via SUBCUTANEOUS

## 2018-01-28 NOTE — Progress Notes (Signed)
Documented by Lynnie Koehler CMA based on hand-written Xolair documentation sheet completed by Tammy Scott CMA, who administered the medication.  

## 2018-01-30 ENCOUNTER — Telehealth: Payer: Self-pay | Admitting: Internal Medicine

## 2018-01-30 NOTE — Telephone Encounter (Signed)
Pt. Is moving to [place], [place] or somewhere closer to [place]. She will be working there.  Pt has found a primary Dr. Claiborne Billings is willing to give her her Xolair injectons.  The practice is [facility], her Dr will be Marylyn Ishihara Lagadapati,  Address:[address]                  [place], [place] [address] Ph.# [phone] I transferred her up front so she could ask them about the release of her records. Routing to [place] and MW to make them aware.

## 2018-01-30 NOTE — Telephone Encounter (Signed)
Left message for patient. If she calls back, please give her the number to medical records [phone]. Thanks!

## 2018-01-30 NOTE — Telephone Encounter (Signed)
Pt needs a medial release for Xolair   [phone]

## 2018-02-03 NOTE — Telephone Encounter (Signed)
Called the patient and gave her the number below. Nothing further needed at this time.

## 2018-02-04 ENCOUNTER — Telehealth: Payer: Self-pay | Admitting: Emergency Medicine

## 2018-02-04 ENCOUNTER — Other Ambulatory Visit: Payer: Self-pay | Admitting: Internal Medicine

## 2018-02-04 NOTE — Telephone Encounter (Signed)
LMTCB for pt to relay Dr. Thurston Hole recs.   ----------------------------------------------------- From: Nyoka Cowden, MD  Sent: 01/27/2018  5:10 PM EDT  To: Delice Lesch McMichael  Subject: RE: sinus CT                   Let her know that's great news and we'll hold off the sinus ct unless the symptoms come right back when she's off the abx as sometimes treatment for sinusitis requires up to 4 weeks to really work but don't like to treat that long without a sinus ct to guide duration of therapy beyond the typical short course she's on    ----- Message -----  From: Donnamae Jude  Sent: 01/27/2018  4:34 PM EDT  To: Christen Butter, CMA, Nyoka Cowden, MD  Subject: sinus CT                     FYI - You put in order on 10/15 for pt to have Sinus CT. We didn't schedule that day because she wanted to check her schedule & call me back. I spoke to her today & she states she saw another doctor & he gave her antibiotics and she feels fine now. Does not want CT. I told her I would let you know    Cordelia Pen

## 2018-02-06 NOTE — Telephone Encounter (Signed)
Pt is calling back [phone]

## 2018-02-06 NOTE — Telephone Encounter (Signed)
I spoke with patient in regards to an appointment she had next week with MW. She stated that she would like to cancel this appointment and all other appointments with MW at this time. She is currently searching for a new provider. Advised patient to call us if she needed anything, she expressed understanding. Appointment has been cancelled.   Nothing further needed at time of call.

## 2018-02-12 ENCOUNTER — Ambulatory Visit: Payer: 59 | Admitting: Internal Medicine

## 2018-02-12 ENCOUNTER — Telehealth: Payer: Self-pay | Admitting: Internal Medicine

## 2018-02-12 NOTE — Telephone Encounter (Signed)
Called patient unable to reach left message to give us a call back.

## 2018-02-12 NOTE — Telephone Encounter (Signed)
Patient called needing form for moving medical records.Per patient it was to be e-mailed but she never received. She will pick up the form when she comes in for her injection 02/17/18  Per patient her injection is not ready (spun) when she comes in. Requesting that it be ready for her next visit.

## 2018-02-13 NOTE — Telephone Encounter (Signed)
Spoke with Stacy Hall, pt needs to sign release form at her Dr's office in [place], then they'll request her records. Then she'll be able to get her shots there. Nothing further needed. Called [facility] said pt would need to call them and give them her new address, as well as the address to her new Dr's office. Called pt to make her aware and gave her the ph. #. (Lmom)

## 2018-02-13 NOTE — Telephone Encounter (Signed)
Patient called back; patient states she will be coming in for her injection on Monday 02/17/2018; she is requesting to fill out a form or release so she can start getting her injections at her doctor office in [place], [place]. Her job have been transferred there and it will be easier for her to get her injections there instead of traveling back to [place]..Patient contact # [phone]

## 2018-02-13 NOTE — Telephone Encounter (Signed)
Called patient unable to reach left message to give us a call back. Will route this over to TS.

## 2018-02-13 NOTE — Telephone Encounter (Signed)
Attempted to call pt but line went straight to VM. Left message for pt to return call. 

## 2018-02-17 ENCOUNTER — Ambulatory Visit: Payer: 59

## 2018-03-26 ENCOUNTER — Other Ambulatory Visit: Payer: Self-pay

## 2018-03-26 ENCOUNTER — Emergency Department
Admission: EM | Admit: 2018-03-26 | Discharge: 2018-03-26 | Disposition: A | Discharge status: Home / Self Care | Payer: 59 | Attending: Emergency Medicine | Admitting: Emergency Medicine

## 2018-03-26 ENCOUNTER — Emergency Department: Payer: 59

## 2018-03-26 ENCOUNTER — Encounter: Payer: Self-pay | Admitting: Emergency Medicine

## 2018-03-26 DIAGNOSIS — R1010 Upper abdominal pain, unspecified: Secondary | ICD-10-CM | POA: Diagnosis not present

## 2018-03-26 DIAGNOSIS — E039 Hypothyroidism, unspecified: Secondary | ICD-10-CM | POA: Insufficient documentation

## 2018-03-26 DIAGNOSIS — J45909 Unspecified asthma, uncomplicated: Secondary | ICD-10-CM | POA: Insufficient documentation

## 2018-03-26 DIAGNOSIS — R51 Headache: Secondary | ICD-10-CM | POA: Insufficient documentation

## 2018-03-26 DIAGNOSIS — R109 Unspecified abdominal pain: Secondary | ICD-10-CM

## 2018-03-26 DIAGNOSIS — R112 Nausea with vomiting, unspecified: Secondary | ICD-10-CM

## 2018-03-26 DIAGNOSIS — F17210 Nicotine dependence, cigarettes, uncomplicated: Secondary | ICD-10-CM | POA: Diagnosis not present

## 2018-03-26 DIAGNOSIS — I10 Essential (primary) hypertension: Secondary | ICD-10-CM | POA: Diagnosis not present

## 2018-03-26 DIAGNOSIS — Z79899 Other long term (current) drug therapy: Secondary | ICD-10-CM | POA: Insufficient documentation

## 2018-03-26 HISTORY — DX: Disorder of thyroid, unspecified: E07.9

## 2018-03-26 HISTORY — DX: Essential (primary) hypertension: I10

## 2018-03-26 LAB — URINALYSIS, COMPLETE (UACMP) WITH MICROSCOPIC
BACTERIA UA: NONE SEEN
Bilirubin Urine: NEGATIVE
GLUCOSE, UA: NEGATIVE mg/dL
Hgb urine dipstick: NEGATIVE
KETONES UR: NEGATIVE mg/dL
Leukocytes, UA: NEGATIVE
Nitrite: NEGATIVE
PROTEIN: 30 mg/dL — AB
Specific Gravity, Urine: 1.02 (ref 1.005–1.030)
pH: 7 (ref 5.0–8.0)

## 2018-03-26 LAB — COMPREHENSIVE METABOLIC PANEL
ALBUMIN: 4 g/dL (ref 3.5–5.0)
ALT: 57 U/L — ABNORMAL HIGH (ref 0–44)
ANION GAP: 10 (ref 5–15)
AST: 35 U/L (ref 15–41)
Alkaline Phosphatase: 70 U/L (ref 38–126)
BILIRUBIN TOTAL: 0.7 mg/dL (ref 0.3–1.2)
BUN: 14 mg/dL (ref 6–20)
CHLORIDE: 102 mmol/L (ref 98–111)
CO2: 25 mmol/L (ref 22–32)
Calcium: 9 mg/dL (ref 8.9–10.3)
Creatinine, Ser: 0.61 mg/dL (ref 0.44–1.00)
GFR calc Af Amer: >60 mL/min (ref >60)
GFR calc non Af Amer: >60 mL/min (ref >60)
GLUCOSE: 133 mg/dL — AB (ref 70–99)
Potassium: 3.7 mmol/L (ref 3.5–5.1)
SODIUM: 137 mmol/L (ref 135–145)
TOTAL PROTEIN: 7.9 g/dL (ref 6.5–8.1)

## 2018-03-26 LAB — CBC
HEMATOCRIT: 44.4 % (ref 36.0–46.0)
HEMOGLOBIN: 15.2 g/dL — AB (ref 12.0–15.0)
MCH: 29 pg (ref 26.0–34.0)
MCHC: 34.2 g/dL (ref 30.0–36.0)
MCV: 84.6 fL (ref 80.0–100.0)
Platelets: 351 10*3/uL (ref 150–400)
RBC: 5.25 MIL/uL — AB (ref 3.87–5.11)
RDW: 12.8 % (ref 11.5–15.5)
WBC: 16.6 10*3/uL — ABNORMAL HIGH (ref 4.0–10.5)
nRBC: 0 % (ref 0.0–0.2)

## 2018-03-26 LAB — POCT PREGNANCY, URINE: Preg Test, Ur: NEGATIVE

## 2018-03-26 LAB — LIPASE, BLOOD: Lipase: 30 U/L (ref 11–51)

## 2018-03-26 MED ORDER — FAMOTIDINE 20 MG PO TABS
20.0000 mg | ORAL_TABLET | Freq: Once | ORAL | Status: AC
  Administered 2018-03-26: 20 mg via ORAL
  Filled 2018-03-26: qty 1

## 2018-03-26 MED ORDER — ONDANSETRON HCL 4 MG/2ML IJ SOLN
4.0000 mg | Freq: Four times a day (QID) | INTRAMUSCULAR | Status: DC | PRN
  Administered 2018-03-26: 4 mg via INTRAVENOUS
  Filled 2018-03-26: qty 2

## 2018-03-26 MED ORDER — ONDANSETRON HCL 4 MG/2ML IJ SOLN
4.0000 mg | Freq: Once | INTRAMUSCULAR | Status: AC | PRN
  Administered 2018-03-26: 4 mg via INTRAVENOUS
  Filled 2018-03-26: qty 2

## 2018-03-26 MED ORDER — ALUM & MAG HYDROXIDE-SIMETH 200-200-20 MG/5ML PO SUSP
15.0000 mL | Freq: Once | ORAL | Status: AC
  Administered 2018-03-26: 15 mL via ORAL
  Filled 2018-03-26: qty 30

## 2018-03-26 MED ORDER — ONDANSETRON 4 MG PO TBDP: 4.0000 mg | ORAL_TABLET | Freq: Four times a day (QID) | ORAL | 0 refills | Status: AC | PRN

## 2018-03-26 MED ORDER — SODIUM CHLORIDE 0.9 % IV BOLUS
1000.0000 mL | Freq: Once | INTRAVENOUS | Status: AC
  Administered 2018-03-26: 1000 mL via INTRAVENOUS

## 2018-03-26 MED ORDER — METOCLOPRAMIDE HCL 5 MG/ML IJ SOLN
10.0000 mg | Freq: Once | INTRAMUSCULAR | Status: AC
  Administered 2018-03-26: 10 mg via INTRAVENOUS
  Filled 2018-03-26: qty 2

## 2018-03-26 NOTE — ED Notes (Signed)
PT states nausea and headache are now tolerable.  States she is feeling better than when she arrived.  PT awaiting results of US at this time.  Will continue to monitor.

## 2018-03-26 NOTE — ED Triage Notes (Signed)
PT to ER via EMS from home with reports of n/v/d and headache.  Pt reports ETOH last night and takinig her regular medications.  Pt states n/v started around 3AM.

## 2018-03-26 NOTE — ED Provider Notes (Signed)
[facility] Emergency Department Provider Note   ____________________________________________   First MD Initiated Contact with Patient 03/26/18 [phone]     (approximate)  I have reviewed the triage vital signs and the nursing notes.   HISTORY  Chief Complaint Nausea; Emesis; and Diarrhea    HPI Stacy Hall is a 37 y.o. female   reports last night that she felt fine, however she did drink a little more than usual including 2 mixed drinks and a couple sips of beer.  However, she reports that that a large amount of alcohol for her.  She reports that about 3 AM she started feeling nauseated and vomiting.  She has had nausea and vomited several times.  After vomiting several times she started feeling she is getting a headache frontal pounding headache.  No fever.  No neck pain or stiffness.  Does report a discomfort in the upper abdomen but not really "pain".  Does not wish for any pain medicine at this time.  Just reports she is feeling extremely nauseated.  No chest pain or trouble breathing.  No leg edema.  Denies that her abdomen really "hurts" but rather just feels extremely nauseated.    Past Medical History:  Diagnosis Date  . Asthma   . Hypertension   . Thyroid disease    hypothyroid    Patient Active Problem List   Diagnosis Date Noted  . Allergic asthma 01/03/2018  . Cigarette smoker 01/03/2018  . Morbid obesity due to excess calories (HCC) 01/03/2018    History reviewed. No pertinent surgical history.  Prior to Admission medications   Medication Sig Start Date End Date Taking? Authorizing Provider  albuterol (PROAIR HFA) 108 (90 Base) MCG/ACT inhaler Inhale 2 puffs into the lungs every 6 (six) hours as needed for wheezing or shortness of breath.   Yes [provider]  FLUoxetine (PROZAC) 20 MG capsule Take 20 mg by mouth daily.   Yes [provider]  ipratropium-albuterol (DUONEB) 0.5-2.5 (3) MG/3ML SOLN Take 3 mLs by  nebulization every 6 (six) hours as needed. 12/29/17  Yes Emily Filbert, MD  levothyroxine (SYNTHROID, LEVOTHROID) 50 MCG tablet Take 50 mcg by mouth daily before breakfast.   Yes [provider]  loratadine-pseudoephedrine (CLARITIN-D 24-HOUR) 10-240 MG 24 hr tablet Take 1 tablet by mouth daily.   Yes [provider]  omalizumab Geoffry Paradise) 150 MG injection Inject into the skin every 14 (fourteen) days.   Yes [provider]  omeprazole (PRILOSEC) 40 MG capsule TAKE 1 CAPSULE 30-60 MINUTES BEFORE YOUR FIRST AND LAST MEALS OF THE DAY 02/04/18  Yes Nyoka Cowden, MD  budesonide-formoterol [facility]) 160-4.5 MCG/ACT inhaler Take 2 puffs first thing in am and then another 2 puffs about 12 hours later. 01/03/18   Nyoka Cowden, MD  ondansetron (ZOFRAN ODT) 4 MG disintegrating tablet Take 1 tablet (4 mg total) by mouth every 6 (six) hours as needed for nausea or vomiting. 03/26/18   Sharyn Creamer, MD    Allergies Biaxin [clarithromycin] and Sulfa antibiotics  History reviewed. No pertinent family history.  Social History Social History   Tobacco Use  . Smoking status: Current Some Day Smoker    Packs/day: 0.10    Years: 10.00    Pack years: 1.00  . Smokeless tobacco: Never Used  Substance Use Topics  . Alcohol use: Yes  . Drug use: Never    Review of Systems Constitutional: No fever/chills Eyes: No visual changes. ENT: No sore throat. Cardiovascular:  Denies chest pain. Respiratory: Denies shortness of breath. Gastrointestinal: No abdominal pain.  Ports rather just very very nauseated and sort of a strange discomfort in her right upper abdomen but not really pain. Genitourinary: Negative for dysuria.  Absolutely denies pregnancy.  Reports no chance of pregnancy. Musculoskeletal: Negative for back pain. Skin: Negative for rash. Neurological: Negative for areas of focal weakness or numbness.  Did have a bit of a frontal headache after vomiting several  times.  Was not sudden onset.  Is not the worst headache of her life.  Does however report is unusual for her to have headaches.  No neck pain or neck stiffness.    ____________________________________________   PHYSICAL EXAM:  VITAL SIGNS: ED Triage Vitals  Enc Vitals Group     BP 03/26/18 0750 (!) 149/115     Pulse Rate 03/26/18 0750 72     Resp 03/26/18 0750 18     Temp --      Temp src --      SpO2 03/26/18 0750 98 %     Weight 03/26/18 0751 238 lb (108 kg)     Height 03/26/18 0751 5\' 3"  (1.6 m)     Head Circumference --      Peak Flow --      Pain Score 03/26/18 0751 9     Pain Loc --      Pain Edu? --      Excl. in GC? --     Constitutional: Alert and oriented.  Ill-appearing, sitting up dry heaving appears quite nauseated.  She is very pleasant but appears acutely nauseated. Eyes: Conjunctivae are normal.  No photophobia. Head: Atraumatic. Nose: No congestion/rhinnorhea. Mouth/Throat: Mucous membranes are moist. Neck: No stridor.  Full range of motion neck without pain.  No meningismus. Cardiovascular: Normal rate, regular rhythm. Grossly normal heart sounds.  Good peripheral circulation. Respiratory: Normal respiratory effort.  No retractions. Lungs CTAB. Gastrointestinal: Soft and nontender except she does report some mild discomfort to palpation in the right upper quadrant and epigastrium. No distention.  No focal right lower quadrant pain.  No rebound guarding or peritonitis any quadrant. Musculoskeletal: No lower extremity tenderness nor edema. Neurologic:  Normal speech and language. No gross focal neurologic deficits are appreciated.  Skin:  Skin is warm, dry and intact. No rash noted. Psychiatric: Mood and affect are normal. Speech and behavior are normal.  ____________________________________________   LABS (all labs ordered are listed, but only abnormal results are displayed)  Labs Reviewed  COMPREHENSIVE METABOLIC PANEL - Abnormal; Notable for the  following components:      Result Value   Glucose, Bld 133 (*)    ALT 57 (*)    All other components within normal limits  CBC - Abnormal; Notable for the following components:   WBC 16.6 (*)    RBC 5.25 (*)    Hemoglobin 15.2 (*)    All other components within normal limits  URINALYSIS, COMPLETE (UACMP) WITH MICROSCOPIC - Abnormal; Notable for the following components:   Color, Urine YELLOW (*)    APPearance CLEAR (*)    Protein, ur 30 (*)    All other components within normal limits  LIPASE, BLOOD  POC URINE PREG, ED  POCT PREGNANCY, URINE   ____________________________________________  EKG   ____________________________________________  RADIOLOGY  Ct Head Wo Contrast  Result Date: 03/26/2018 CLINICAL DATA:  Headache. EXAM: CT HEAD WITHOUT CONTRAST TECHNIQUE: Contiguous axial images were obtained from the base of the skull through the vertex  without intravenous contrast. COMPARISON:  None. FINDINGS: Brain: There is no evidence for acute hemorrhage, hydrocephalus, mass lesion, or abnormal extra-axial fluid collection. No definite CT evidence for acute infarction. Vascular: No hyperdense vessel or unexpected calcification. Skull: No evidence for fracture. No worrisome lytic or sclerotic lesion. Sinuses/Orbits: The visualized paranasal sinuses and mastoid air cells are clear. Visualized portions of the globes and intraorbital fat are unremarkable. Other: None. IMPRESSION: Normal CT evaluation of the brain. The Electronically Signed   By: Kennith CenterEric  Mansell M.D.   On: 03/26/2018 08:37   [place] Abdomen Limited Ruq  Result Date: 03/26/2018 CLINICAL DATA:  Abdominal pain starting at 3 a.m. today EXAM: ULTRASOUND ABDOMEN LIMITED RIGHT UPPER QUADRANT COMPARISON:  None. FINDINGS: Gallbladder: No gallstones or wall thickening visualized. No sonographic Murphy sign noted by sonographer. Common bile duct: Diameter: 1.4 mm Liver: No focal lesion identified. Accentuated echogenicity and sonic  attenuation suspicious for least mild hepatic steatosis. Portal vein is patent on color Doppler imaging with normal direction of blood flow towards the liver. IMPRESSION: 1. Suspected mild attic steatosis. Otherwise, no significant abnormalities are observed. Electronically Signed   By: Gaylyn RongWalter  Liebkemann M.D.   On: 03/26/2018 09:34    Imaging results reviewed, no acute pathology to explain patient's symptoms. ____________________________________________   PROCEDURES  Procedure(s) performed: None  Procedures  Critical Care performed: No  ____________________________________________   INITIAL IMPRESSION / ASSESSMENT AND PLAN / ED COURSE  Pertinent labs & imaging results that were available during my care of the patient were reviewed by me and considered in my medical decision making (see chart for details).   Differential diagnosis includes but is not limited to, abdominal perforation, aortic dissection, cholecystitis, appendicitis, diverticulitis, colitis, esophagitis/gastritis, kidney stone, pyelonephritis, urinary tract infection, aortic aneurysm. All are considered in decision and treatment plan. Based upon the patient's presentation and risk factors, I suspect some type of epigastric discomfort, possibly gastrointestinal self-limited disease.  She has a very reassuring clinical examination but does report some notable nausea.  Also question if perhaps she had alcoholic gastritis or gastritis induced by her alcohol use last night.  No lower abdominal pain.  No right lower quadrant pain.  Very reassuring hemodynamics.  Afebrile.  Does have leukocytosis, possibly due to infectious or reactive etiology.   Clinical Course as of Mar 27 1443  Wed Mar 26, 2018  1127 Patient reports pain is gone, just continues to feel nauseated.  Feels like if she was try you think she would vomit it back up.  Will trial Reglan, Maalox, Pepcid.  Some slight ongoing tenderness in epigastrium.  Denies ongoing  headache.  She does report she is feeling better about what like to try some more nausea medicine.  Continue to observe.   [MQ]  1236 Patient reports she is feeling a lot better.  She would like to have something to eat and more water to drink.  She just drank some ginger ale and she reports she felt fine.  Reexamination her abdomen soft nontender nondistended.  No pain or discomfort in any quadrant.  She reports she is feeling a lot better.   [MQ]  1236 I discussed with the patient the risks and benefits of abdominal CT scan. The present time there is no clear indication that the patient requires CT, the patient does have an abdominal complaint but exam does not suggest acute surgical abdomen and my suspicion for intra-abdominal infection including appendicitis, cholecystitis, aaa, dissection, ischemia, perforation, pancreatitis, diverticulitis or other acute major intra-abdominal process  is quite low. After discussing the risks and benefits including benefits of additional evaluation for diagnoses, ruling out infection/perforation/aaa/etc, but also discussing the risks including low, "well less than 1%," but not 0 risk of inducing cancers due to radiation and potential risks of contrast the patient indicated via our shared medical decision-making that she would not do a CAT scan. Rather if the patient does have worsening symptoms, develops a high fever, develops pain or persistent discomfort in the right upper quadrant or right lower quadrant, or other new concerns arise they will come back to emergency room right away. As the patient's clinician I think this is a very reasonable decision having discussed general risks and benefits of CT, and my clinical suspicion that CT would be of benefit at this time is very low.    [MQ]    Clinical Course User Index [MQ] Sharyn CreamerQuale, Mark, MD   ----------------------------------------- 2:44 PM on 03/26/2018 -----------------------------------------  Patient is eating  and drinking well.  Has no complaint at this time.  Feels much improved, comfortable plan for discharge with very careful return precautions discussed with the patient.  She will come back right away if she is develop a fever, pain, recurrence of vomiting or other new concerns arise.  ____________________________________________   FINAL CLINICAL IMPRESSION(S) / ED DIAGNOSES  Final diagnoses:  Abdominal pain  Non-intractable vomiting with nausea, unspecified vomiting type        Note:  This document was prepared using Dragon voice recognition software and may include unintentional dictation errors       Sharyn CreamerQuale, Mark, MD 03/26/18 1445

## 2018-03-26 NOTE — Discharge Instructions (Signed)

## 2018-03-26 NOTE — ED Notes (Signed)
PT given crackers and ginger ale per Dr. Fanny BienQuale.  Pt states she is feeling better at this time.

## 2020-10-26 IMAGING — CT CT HEAD W/O CM
3 series · 16 of 47 positions shown, 19 images · non-contrast
Comparison: None.

CLINICAL DATA: Headache.

EXAM:
CT HEAD WITHOUT CONTRAST
TECHNIQUE: Contiguous axial images were obtained from the base of the skull
through the vertex without intravenous contrast.

[Series 2: head wo · axial · 0.39mm/px · z∈[-161,-36]mm · 10 of 31 slices shown, 13 images]
[im 3/31  brain]
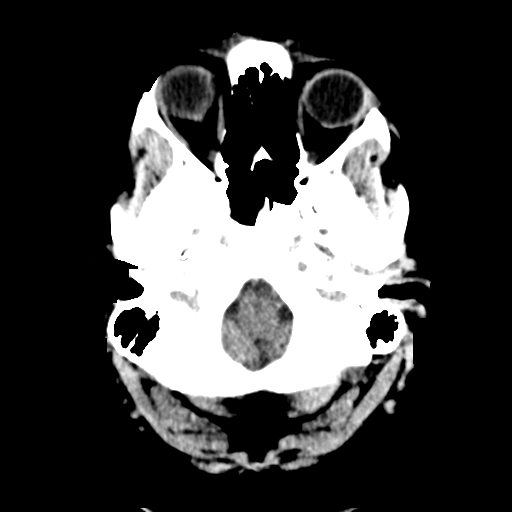
[im 3/31  bone]
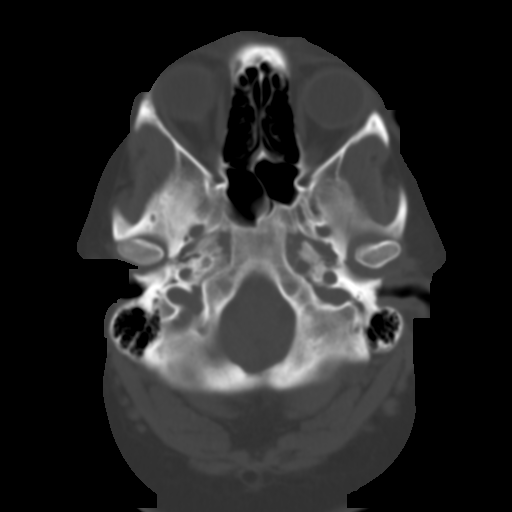
[im 6/31  brain]
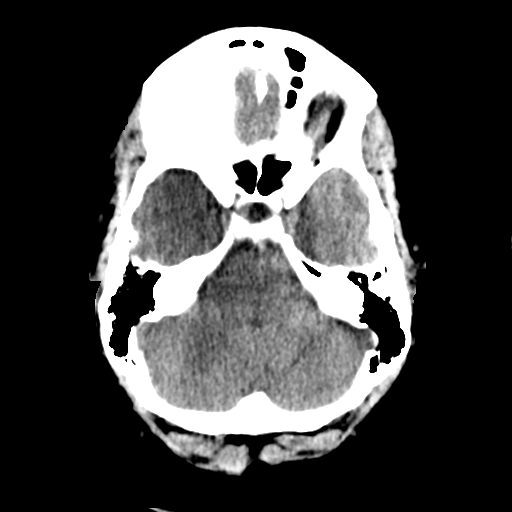
[im 9/31  brain]
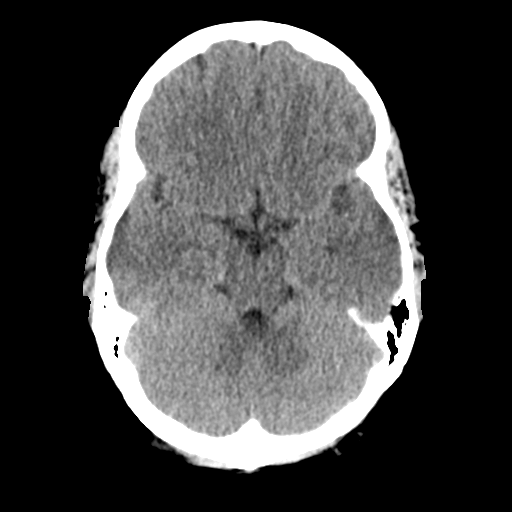
[im 11/31  brain]
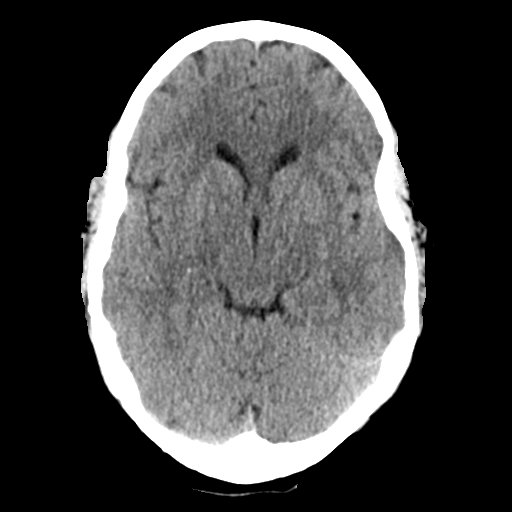
[im 14/31  brain]
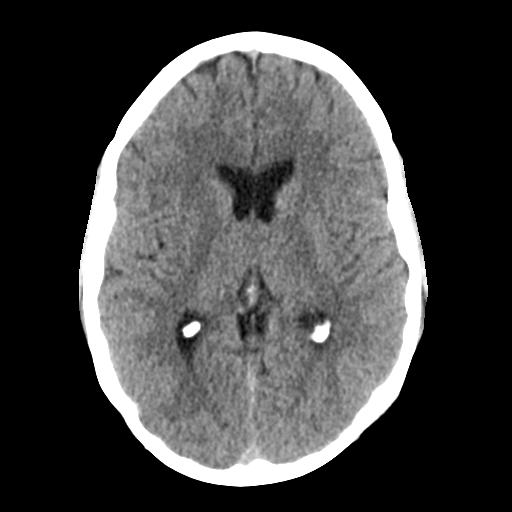
[im 14/31  bone]
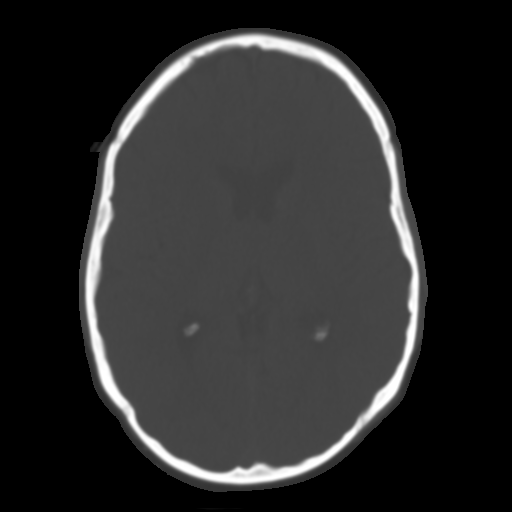
[im 17/31  brain]
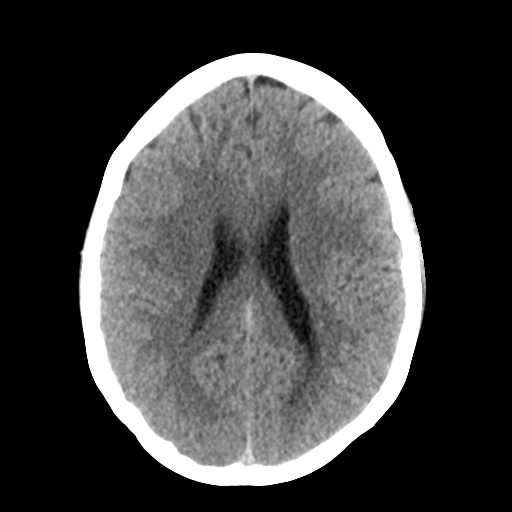
[im 20/31  brain]
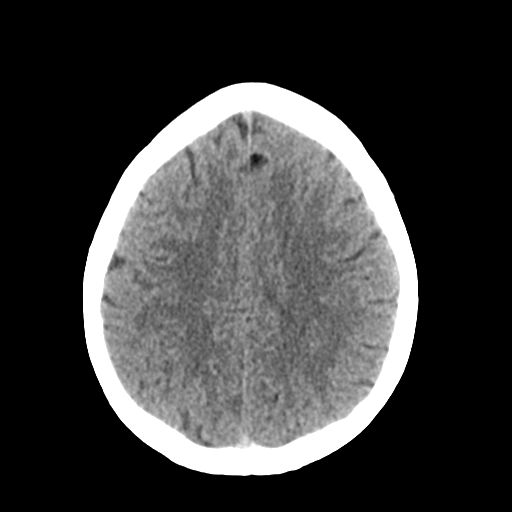
[im 23/31  brain]
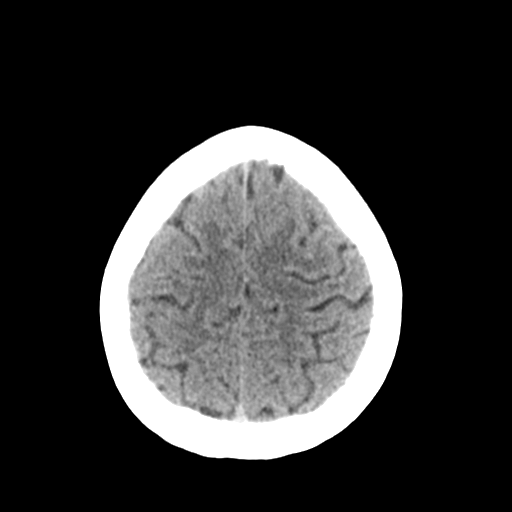
[im 25/31  brain]
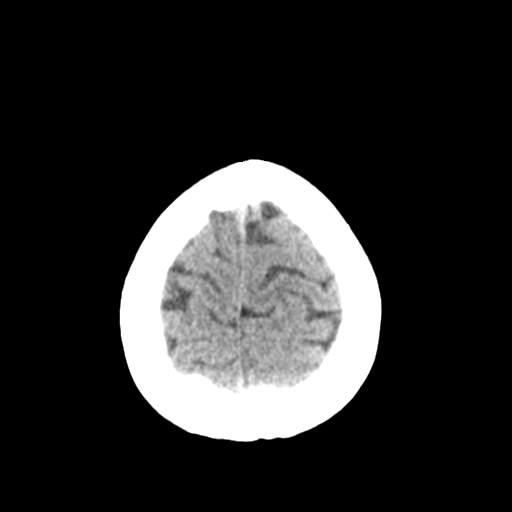
[im 25/31  bone]
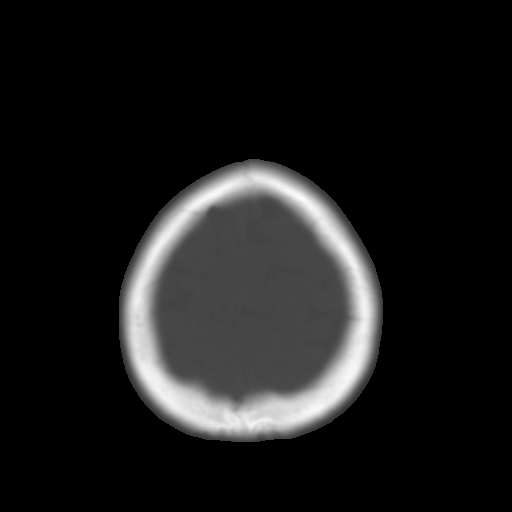
[im 28/31  brain]
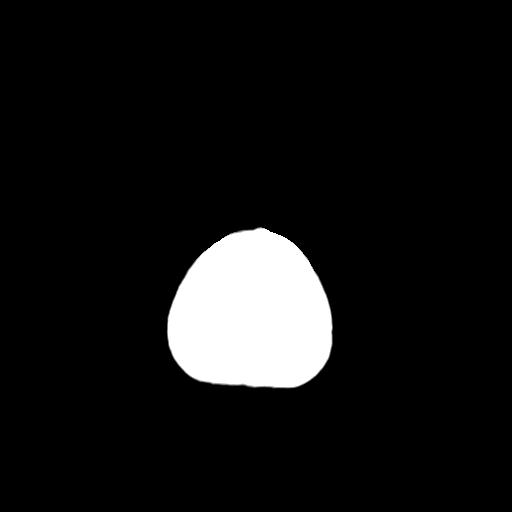

[Series 4: coronal soft tissue · coronal · 0.33mm/px · 3 of 66 slices shown]
[im 22/66  brain]
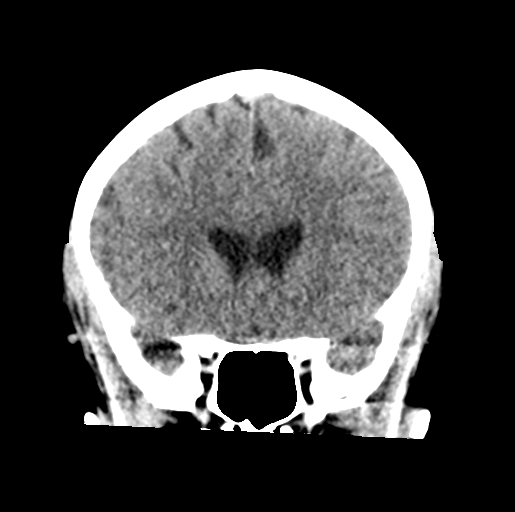
[im 29/66  brain]
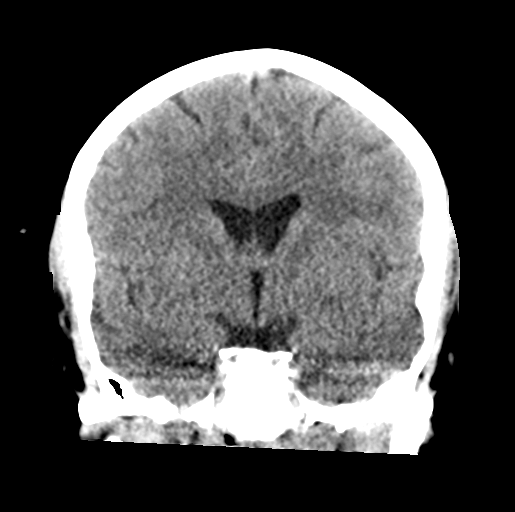
[im 37/66  brain]
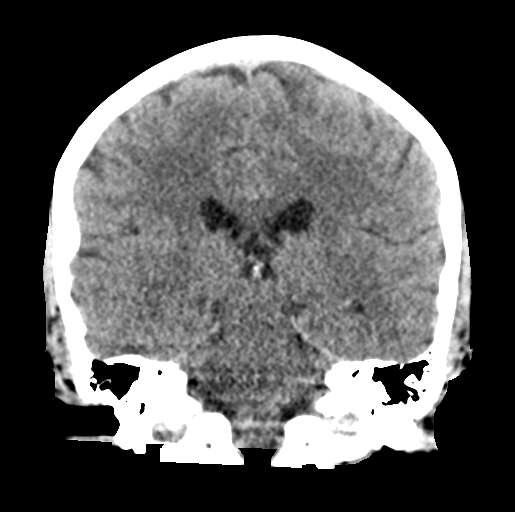

[Series 5: sagittal soft tissue · sagittal · 0.33mm/px · 3 of 51 slices shown]
[im 17/51  brain]
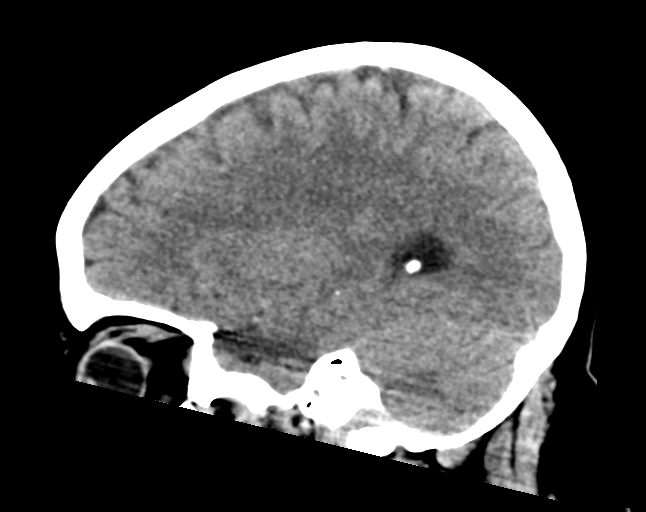
[im 26/51  brain]
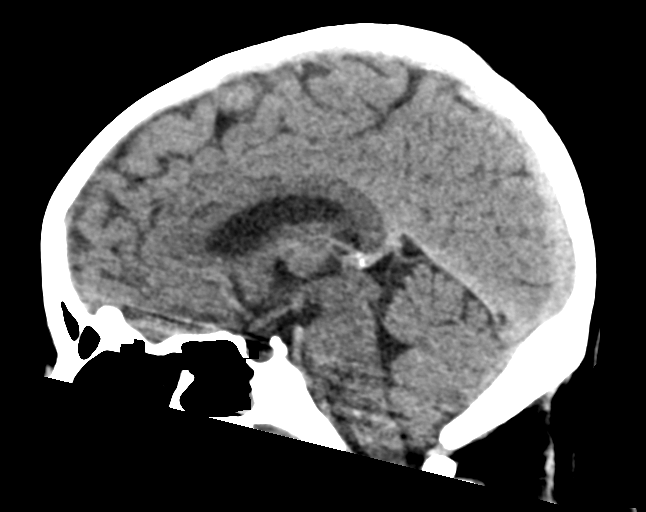
[im 34/51  brain]
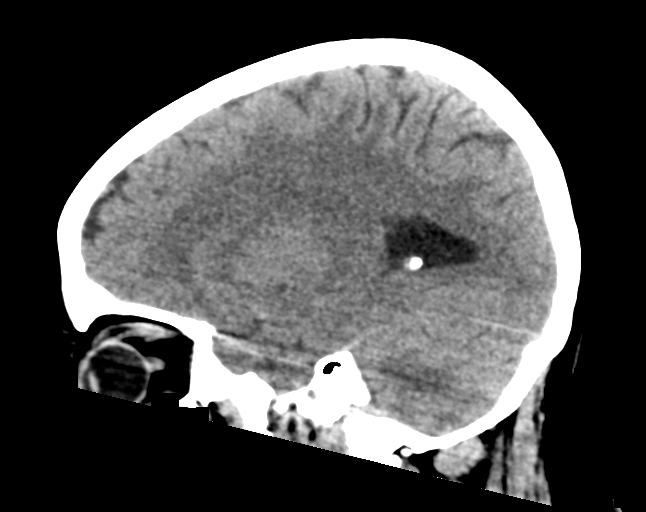

[16 of 47 positions shown; findings below may reference images not displayed]

FINDINGS: Brain: There is no evidence for acute hemorrhage, hydrocephalus,
mass lesion, or abnormal extra-axial fluid collection. No definite
CT evidence for acute infarction.

Vascular: No hyperdense vessel or unexpected calcification.

Skull: No evidence for fracture. No worrisome lytic or sclerotic
lesion.

Sinuses/Orbits: The visualized paranasal sinuses and mastoid air
cells are clear. Visualized portions of the globes and intraorbital
fat are unremarkable.

Other: None.
IMPRESSION: Normal CT evaluation of the brain.

The
